# Patient Record
Sex: Female | Born: 1965 | Race: White | Hispanic: No | Marital: Married
Health system: Southern US, Community
[De-identification: ages and names within clinical notes are randomized; demographics above are authoritative.]

---

## 2007-10-19 ENCOUNTER — Ambulatory Visit: Payer: Self-pay | Admitting: Vascular Surgery

## 2011-01-27 NOTE — Consult Note (Signed)
NEW PATIENT CONSULTATION   Jordan Chen, Jordan Chen  DOB:  1965-11-15                                       10/19/2007  EAVWU#:[id number]   HISTORY:  The patient presents today for evaluation of lower extremity  venous pathology.  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  She reports that she did  have significant varicosities in her left mid thigh and that these  resolved following pregnancy.  She reports that monthly at the time of  her menses she develops soreness over her left pretibial area and left  lateral ankle.  She does have some spider veins telangiectasia at these  areas but no varicose veins or reticular veins.  She does not have any  history of deep venous thrombosis or other venous pathology and does not  note any edema.   PAST MEDICAL HISTORY:  Her past medical history is otherwise  unremarkable for major medical difficulties.   SOCIAL HISTORY:  She is married.  She does not smoke or drink alcohol on  a regular basis.   MEDICATIONS:  Her only medication is Rhinocort.   ALLERGIES:  Penicillin and sulfa.   PHYSICAL EXAMINATION:  A well-developed, well-nourished white female  appears her age of 12.  Blood pressure is 117/67, pulse 76, respirations  18.  Her dorsalis pedis pulses are 2+ bilaterally.  She does not have  any evidence of swelling nor evidence of chronic venous hypertension.  She does have blush of spider veins over the pretibial area on the left  and also on her left lateral ankle.   She underwent a limited handheld venous duplex by me and this showed  normal caliber saphenous vein with no evidence of obvious saphenous  reflux.   I discussed this with the patient.  I explained that I do not see any  evidence of superficial or deep reflux.  I would recommend that she  continue her usual activities without  limitation and I explained that  elevation and compression may give her some benefit from a symptoms  standpoint.  She was questioning sclerotherapy and I explained to her  that this could be treated if she does have a cosmetic concern regarding  the telangiectasia over these areas and I explained that this would be  done as an outpatient in our office.  She will consider this and notify  us if she wishes to proceed.   Larina Earthly, M.D.  Electronically Signed   TFE/MEDQ  D:  10/19/2007  T:  10/21/2007  Job:  [id number]   cc:   Lucila Maine, Dr

## 2013-10-24 ENCOUNTER — Other Ambulatory Visit: Payer: Self-pay

## 2013-10-24 DIAGNOSIS — Z1231 Encounter for screening mammogram for malignant neoplasm of breast: Secondary | ICD-10-CM

## 2013-11-09 ENCOUNTER — Ambulatory Visit: Payer: Self-pay

## 2013-11-10 ENCOUNTER — Ambulatory Visit: Payer: Self-pay

## 2013-12-04 ENCOUNTER — Ambulatory Visit
Admission: RE | Admit: 2013-12-04 | Discharge: 2013-12-04 | Disposition: A | Discharge status: Home / Self Care | Payer: BC Managed Care – PPO | Source: Ambulatory Visit

## 2013-12-04 DIAGNOSIS — Z1231 Encounter for screening mammogram for malignant neoplasm of breast: Secondary | ICD-10-CM

## 2013-12-06 ENCOUNTER — Ambulatory Visit: Payer: Self-pay

## 2013-12-07 ENCOUNTER — Ambulatory Visit: Payer: Self-pay

## 2014-11-07 ENCOUNTER — Other Ambulatory Visit: Payer: Self-pay

## 2014-11-07 DIAGNOSIS — Z1231 Encounter for screening mammogram for malignant neoplasm of breast: Secondary | ICD-10-CM

## 2014-12-31 ENCOUNTER — Ambulatory Visit: Payer: Self-pay

## 2017-09-20 ENCOUNTER — Other Ambulatory Visit: Payer: Self-pay | Admitting: Family Medicine

## 2017-09-20 DIAGNOSIS — Z1231 Encounter for screening mammogram for malignant neoplasm of breast: Secondary | ICD-10-CM

## 2017-10-13 ENCOUNTER — Ambulatory Visit
Admission: RE | Admit: 2017-10-13 | Discharge: 2017-10-13 | Disposition: A | Discharge status: Home / Self Care | Payer: BLUE CROSS/BLUE SHIELD | Source: Ambulatory Visit | Attending: Family Medicine | Admitting: Family Medicine

## 2017-10-13 DIAGNOSIS — Z1231 Encounter for screening mammogram for malignant neoplasm of breast: Secondary | ICD-10-CM

## 2017-10-14 ENCOUNTER — Other Ambulatory Visit: Payer: Self-pay | Admitting: Family Medicine

## 2017-10-14 DIAGNOSIS — R928 Other abnormal and inconclusive findings on diagnostic imaging of breast: Secondary | ICD-10-CM

## 2017-10-15 ENCOUNTER — Ambulatory Visit: Payer: BLUE CROSS/BLUE SHIELD

## 2017-10-15 ENCOUNTER — Ambulatory Visit
Admission: RE | Admit: 2017-10-15 | Discharge: 2017-10-15 | Disposition: A | Discharge status: Home / Self Care | Payer: BLUE CROSS/BLUE SHIELD | Source: Ambulatory Visit | Attending: Family Medicine | Admitting: Family Medicine

## 2017-10-15 DIAGNOSIS — R928 Other abnormal and inconclusive findings on diagnostic imaging of breast: Secondary | ICD-10-CM

## 2017-10-19 ENCOUNTER — Other Ambulatory Visit: Payer: BLUE CROSS/BLUE SHIELD

## 2019-06-12 IMAGING — MG 2D DIGITAL SCREENING BILATERAL MAMMOGRAM WITH 3D TOMO WITH CAD
9 of 13 series · 9 of 29 positions shown · non-contrast
Comparison: Previous exam(s).

CLINICAL DATA: Screening.

EXAM:
2D DIGITAL SCREENING BILATERAL MAMMOGRAM WITH 3D TOMO WITH CAD

[L CC (1 of 2)]
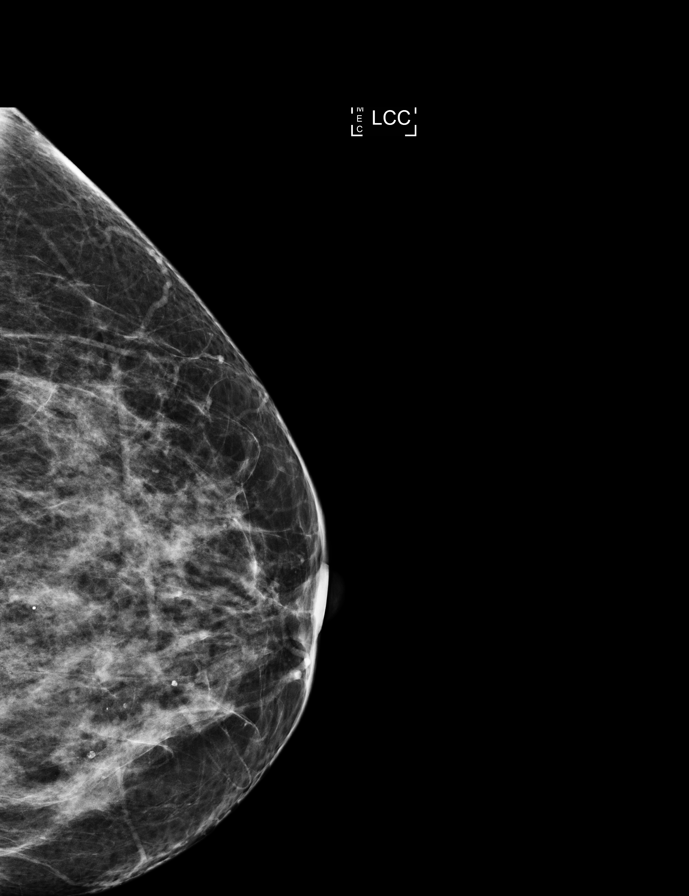

[R MLO synth-2D]
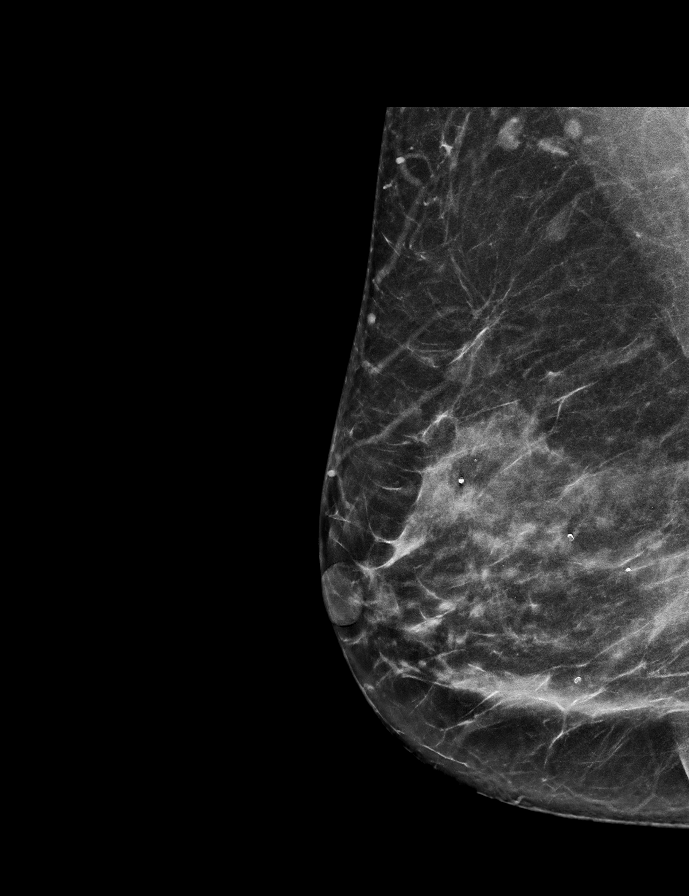

[R CC synth-2D]
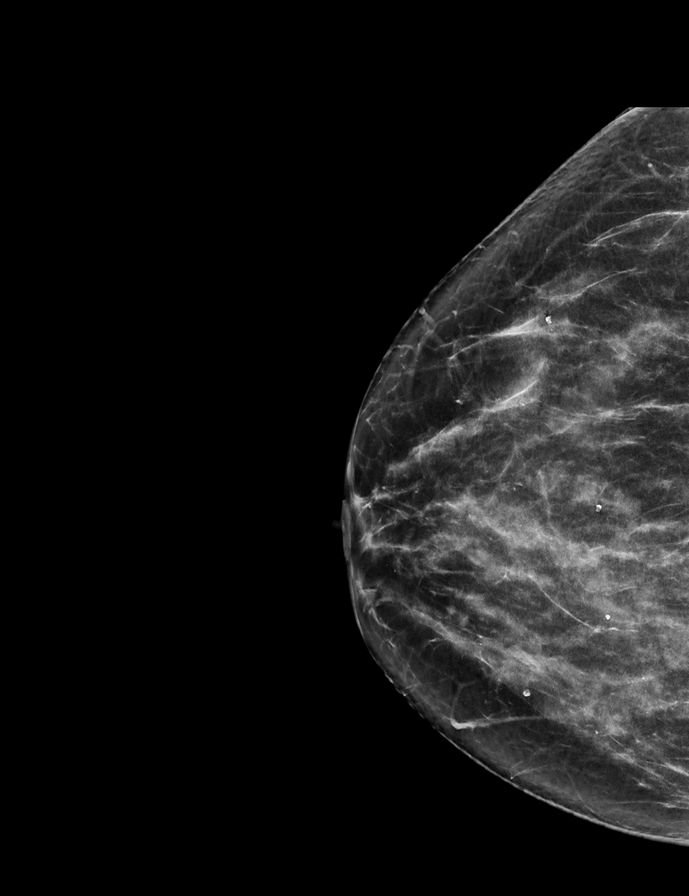

[R MLO]
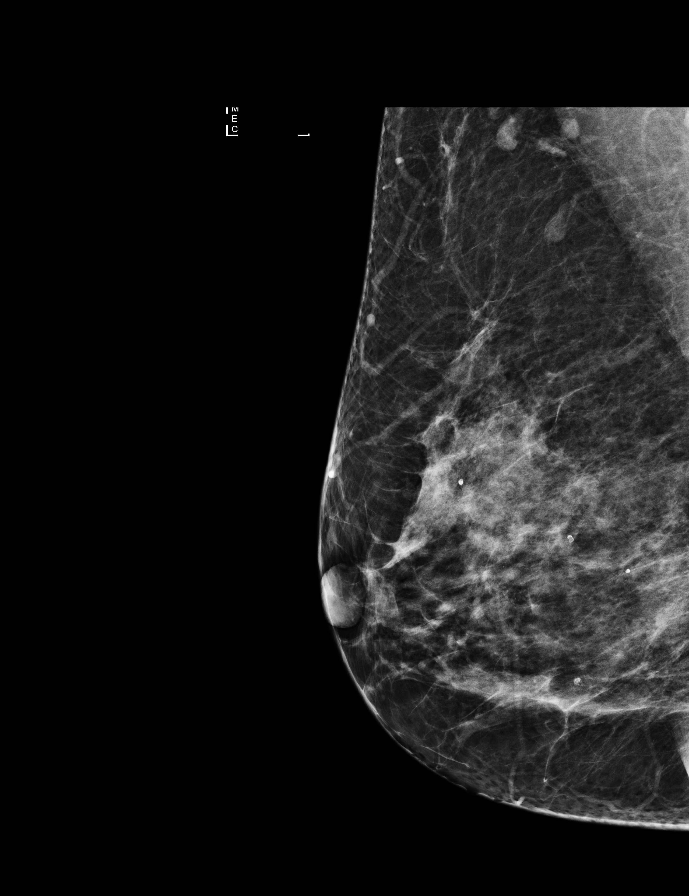

[L CC (2 of 2)]
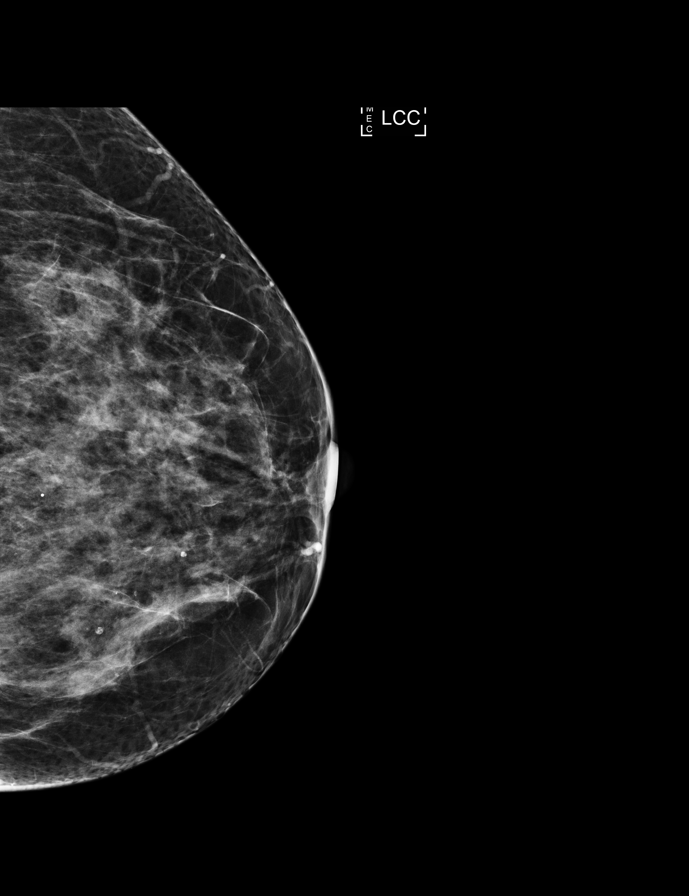

[L CC synth-2D]
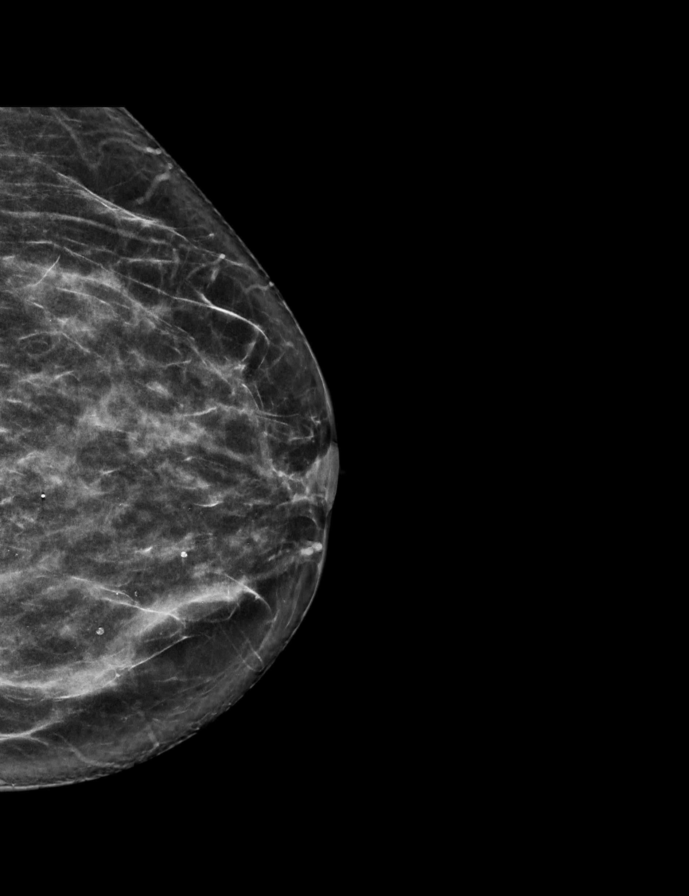

[L MLO synth-2D]
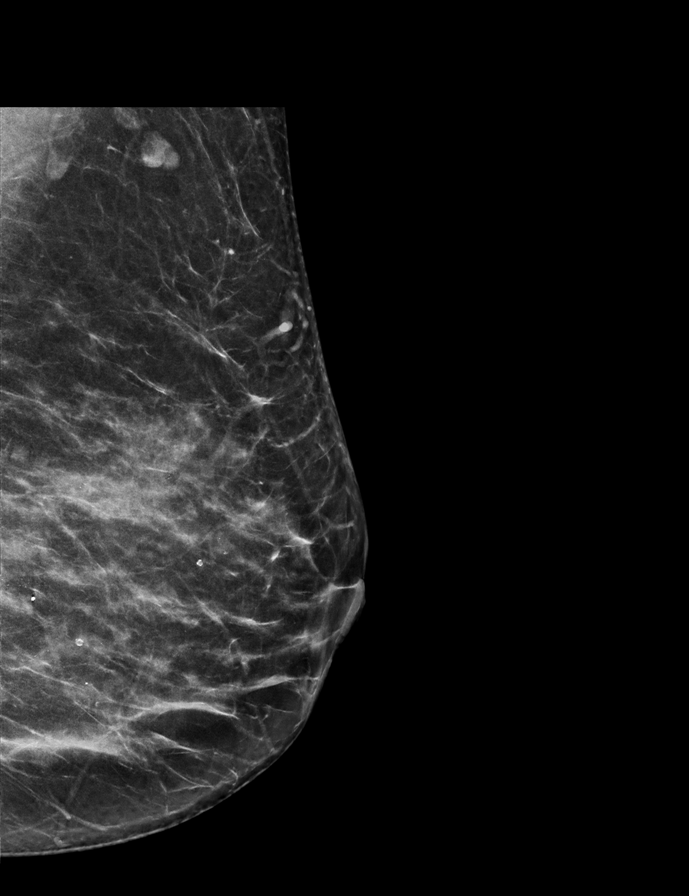

[L MLO]
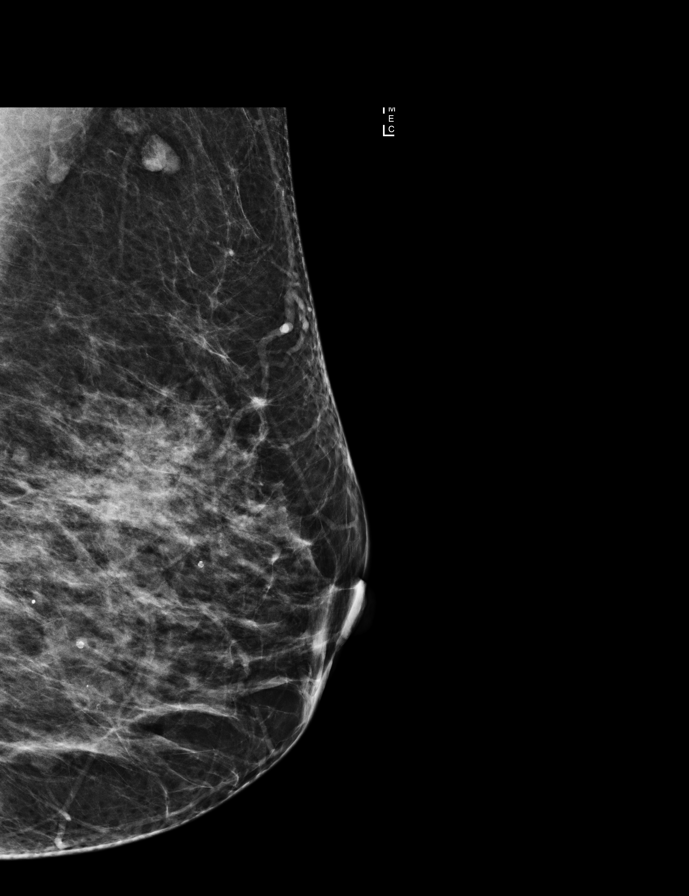

[R CC]
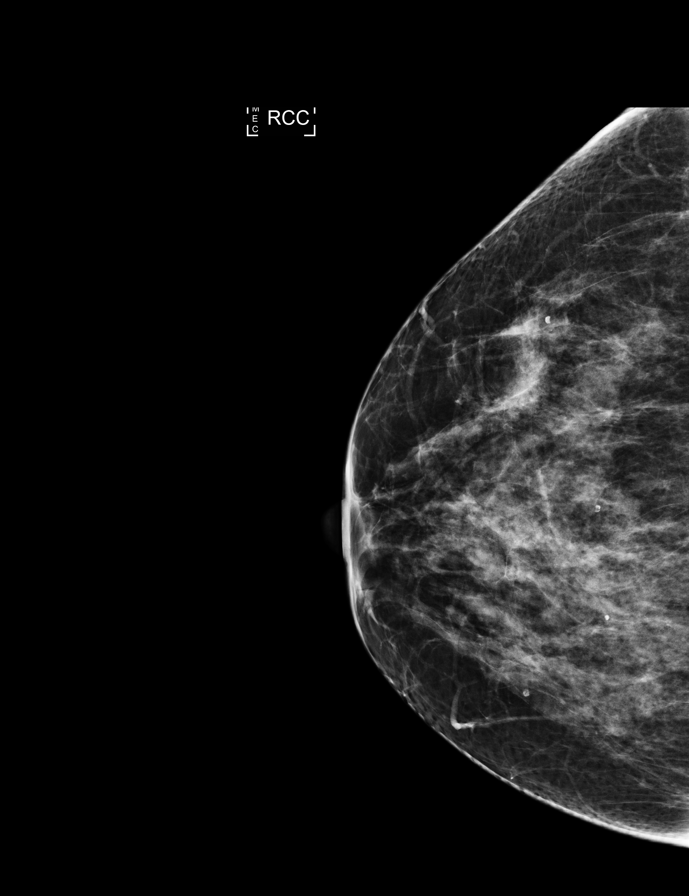

[9 of 29 positions shown; findings below may reference images not displayed]

ACR Breast Density Category c: The breast tissue is heterogeneously
dense, which may obscure small masses.
FINDINGS: In the left breast, possible distortion warrants further evaluation.
In the right breast, no findings suspicious for malignancy. Images
were processed with CAD.
IMPRESSION: Further evaluation is suggested for possible distortion in the left
breast.

RECOMMENDATION:
3D diagnostic mammogram and possibly ultrasound of the left breast.
(Code:[id number]-[id number])

The patient will be contacted regarding the findings, and additional
imaging will be scheduled.

BI-RADS CATEGORY  0: Incomplete. Need additional imaging evaluation
and/or prior mammograms for comparison.

## 2019-10-30 ENCOUNTER — Other Ambulatory Visit: Payer: Self-pay | Admitting: Family Medicine

## 2019-10-30 DIAGNOSIS — Z1231 Encounter for screening mammogram for malignant neoplasm of breast: Secondary | ICD-10-CM

## 2019-11-03 ENCOUNTER — Ambulatory Visit: Payer: BLUE CROSS/BLUE SHIELD

## 2019-12-04 ENCOUNTER — Ambulatory Visit: Payer: BLUE CROSS/BLUE SHIELD

## 2020-01-24 ENCOUNTER — Ambulatory Visit: Payer: BLUE CROSS/BLUE SHIELD

## 2020-05-31 ENCOUNTER — Other Ambulatory Visit: Payer: BLUE CROSS/BLUE SHIELD | Attending: Gastroenterology

## 2020-06-03 ENCOUNTER — Encounter: Admission: RE | Payer: Self-pay | Source: Home / Self Care

## 2020-06-03 ENCOUNTER — Ambulatory Visit: Admission: RE | Admit: 2020-06-03 | Payer: BLUE CROSS/BLUE SHIELD | Source: Home / Self Care

## 2020-06-03 SURGERY — COLONOSCOPY WITH PROPOFOL
Anesthesia: General
